# Patient Record
Sex: Female | Born: 1950 | Race: White | Hispanic: No | Marital: Married | Smoking: Never smoker
Health system: Southern US, Community
[De-identification: ages and names within clinical notes are randomized; demographics above are authoritative.]

## PROBLEM LIST (undated history)

## (undated) DIAGNOSIS — H409 Unspecified glaucoma: Secondary | ICD-10-CM

## (undated) DIAGNOSIS — E119 Type 2 diabetes mellitus without complications: Secondary | ICD-10-CM

## (undated) HISTORY — PX: APPENDECTOMY: SHX54

## (undated) HISTORY — PX: WRIST FRACTURE SURGERY: SHX121

## (undated) HISTORY — PX: LIVER SURGERY: SHX698

## (undated) HISTORY — PX: ABDOMINAL HYSTERECTOMY: SHX81

## (undated) HISTORY — PX: CHOLECYSTECTOMY: SHX55

---

## 2017-09-30 ENCOUNTER — Ambulatory Visit
Admission: RE | Admit: 2017-09-30 | Discharge: 2017-09-30 | Disposition: A | Discharge status: Home / Self Care | Payer: Commercial Managed Care - HMO | Source: Ambulatory Visit | Attending: Orthopedic Surgery | Admitting: Orthopedic Surgery

## 2017-09-30 ENCOUNTER — Other Ambulatory Visit: Payer: Self-pay | Admitting: Orthopedic Surgery

## 2017-09-30 DIAGNOSIS — S52532A Colles' fracture of left radius, initial encounter for closed fracture: Secondary | ICD-10-CM | POA: Diagnosis present

## 2017-09-30 DIAGNOSIS — S52572A Other intraarticular fracture of lower end of left radius, initial encounter for closed fracture: Secondary | ICD-10-CM | POA: Insufficient documentation

## 2017-09-30 DIAGNOSIS — X58XXXA Exposure to other specified factors, initial encounter: Secondary | ICD-10-CM | POA: Diagnosis not present

## 2018-01-06 ENCOUNTER — Other Ambulatory Visit: Payer: Self-pay

## 2018-01-06 ENCOUNTER — Ambulatory Visit
Admission: EM | Admit: 2018-01-06 | Discharge: 2018-01-06 | Disposition: A | Discharge status: Home / Self Care | Payer: 59 | Attending: Family Medicine | Admitting: Family Medicine

## 2018-01-06 DIAGNOSIS — R3915 Urgency of urination: Secondary | ICD-10-CM

## 2018-01-06 DIAGNOSIS — R350 Frequency of micturition: Secondary | ICD-10-CM | POA: Diagnosis not present

## 2018-01-06 DIAGNOSIS — R300 Dysuria: Secondary | ICD-10-CM

## 2018-01-06 HISTORY — DX: Type 2 diabetes mellitus without complications: E11.9

## 2018-01-06 HISTORY — DX: Unspecified glaucoma: H40.9

## 2018-01-06 LAB — URINALYSIS, COMPLETE (UACMP) WITH MICROSCOPIC
BILIRUBIN URINE: NEGATIVE
Glucose, UA: NEGATIVE mg/dL
HGB URINE DIPSTICK: NEGATIVE
KETONES UR: NEGATIVE mg/dL
Leukocytes, UA: NEGATIVE
NITRITE: NEGATIVE
PROTEIN: NEGATIVE mg/dL
SPECIFIC GRAVITY, URINE: 1.02 (ref 1.005–1.030)
pH: 5.5 (ref 5.0–8.0)

## 2018-01-06 MED ORDER — NITROFURANTOIN MONOHYD MACRO 100 MG PO CAPS: 100.0000 mg | ORAL_CAPSULE | Freq: Two times a day (BID) | ORAL | 0 refills | Status: AC

## 2018-01-06 NOTE — Discharge Instructions (Addendum)
Take medication as prescribed. Rest. Drink plenty of fluids.  ° °Follow up with your primary care physician this week as needed. Return to Urgent care for new or worsening concerns.  ° °

## 2018-01-06 NOTE — ED Provider Notes (Signed)
[facility] ____________________________________________  Time seen: Approximately 8:56 AM  I have reviewed the triage vital signs and the nursing notes.   HISTORY  Chief Complaint Urinary Frequency  HPI Cathy Fields is a 67 y.o. female past medical history of diabetes presenting for evaluation of urinary complaints.  Reports over the last 1 week she has noticed some intermittent issues and she was concern of urinary tract infections being the culprit.  Patient reports last week she had left-sided back pain, that did resolve after applying heating pad and resting.  Reports no longer having back pain.  Reports she has intermittently been noticing that she is urinating more frequently, getting up more at night and having some urgency.  Reports a few days ago she noticed some odor to her urine, none today.  States she has a history of similar presentation with urinary tract infections.  Denies frequent or recurrent urinary tract infections.  Denies any vaginal discharge, vaginal itching or symptoms similar to previous yeast infections.  Continues to eat and drink well.  States last night she felt warm but denies known fevers.  No antipyretics taken prior to arrival.  Has been drinking a lot of water and did take some over-the-counter Azo a few days ago which did help with symptoms.  Denies other aggravating or alleviating factors.  Denies chest pain, shortness of breath, abdominal pain, dysuria, extremity pain, extremity swelling or rash. Denies recent sickness. Denies recent antibiotic use. Denies renal insufficiency.  Filbert Berthold, MD: PCP   Past Medical History:  Diagnosis Date  . Diabetes (HCC)   . Glaucoma     There are no active problems to display for this patient.   Past Surgical History:  Procedure Laterality Date  . ABDOMINAL HYSTERECTOMY    . APPENDECTOMY    . CHOLECYSTECTOMY    . LIVER SURGERY    . WRIST FRACTURE SURGERY Left      No current  facility-administered medications for this encounter.   Current Outpatient Medications:  .  aspirin 81 MG tablet, Take by mouth., Disp: , Rfl:  .  Cholecalciferol (VITAMIN D) 2000 units CAPS, Take by mouth., Disp: , Rfl:  .  cyclobenzaprine (FLEXERIL) 10 MG tablet, Take by mouth., Disp: , Rfl:  .  dorzolamide-timolol (COSOPT) 22.3-6.8 MG/ML ophthalmic solution, , Disp: , Rfl:  .  glimepiride (AMARYL) 4 MG tablet, glimepiride 4 mg tablet, Disp: , Rfl:  .  ibuprofen (ADVIL,MOTRIN) 800 MG tablet, TK 1 T PO TID, Disp: , Rfl: 0 .  latanoprost (XALATAN) 0.005 % ophthalmic solution, latanoprost 0.005 % eye drops  INSTILL 1 DROP INTO BOTH EYES Q NIGHT, Disp: , Rfl:  .  lisinopril (PRINIVIL,ZESTRIL) 10 MG tablet, lisinopril 10 mg tablet, Disp: , Rfl:  .  metFORMIN (GLUCOPHAGE-XR) 500 MG 24 hr tablet, metformin ER 500 mg tablet,extended release 24 hr, Disp: , Rfl:  .  pioglitazone (ACTOS) 45 MG tablet, pioglitazone 45 mg tablet, Disp: , Rfl:  .  rosuvastatin (CRESTOR) 20 MG tablet, rosuvastatin 20 mg tablet, Disp: , Rfl:  .  timolol (TIMOPTIC-XR) 0.5 % ophthalmic gel-forming, , Disp: , Rfl:  .  nitrofurantoin, macrocrystal-monohydrate, (MACROBID) 100 MG capsule, Take 1 capsule (100 mg total) by mouth 2 (two) times daily., Disp: 10 capsule, Rfl: 0  Allergies Atorvastatin and Codeine  Family History  Problem Relation Age of Onset  . Heart disease Mother   . Dementia Father     Social History Social History   Tobacco Use  . Smoking status:  Never Smoker  . Smokeless tobacco: Never Used  Substance Use Topics  . Alcohol use: Never    Frequency: Never  . Drug use: Never    Review of Systems Constitutional: As above. No measured fever. Cardiovascular: Denies chest pain. Respiratory: Denies shortness of breath. Gastrointestinal: No abdominal pain.  No nausea, no vomiting.  No diarrhea.   Genitourinary: Negative for dysuria. Musculoskeletal: As above. Skin: Negative for  rash.  ____________________________________________   PHYSICAL EXAM:  VITAL SIGNS: ED Triage Vitals  Enc Vitals Group     BP 01/06/18 0830 137/83     Pulse Rate 01/06/18 0830 84     Resp 01/06/18 0830 17     Temp 01/06/18 0830 98 F (36.7 C)     Temp Source 01/06/18 0830 Oral     SpO2 01/06/18 0830 100 %     Weight 01/06/18 0822 190 lb (86.2 kg)     Height 01/06/18 0822  (1.651 m)     Head Circumference --      Peak Flow --      Pain Score 01/06/18 0822 3     Pain Loc --      Pain Edu? --      Excl. in GC? --     Constitutional: Alert and oriented. Well appearing and in no acute distress. Cardiovascular: Normal rate, regular rhythm. Grossly normal heart sounds.  Good peripheral circulation. Respiratory: Normal respiratory effort without tachypnea nor retractions. Breath sounds are clear and equal bilaterally. No wheezes, rales, rhonchi. Gastrointestinal: Soft and nontender. No CVA tenderness. Musculoskeletal:   No midline cervical, thoracic or lumbar tenderness to palpation.Steady gait. Neurologic:  Normal speech and language. Speech is normal. No gait instability.  Skin:  Skin is warm, dry and intact. No rash noted. Psychiatric: Mood and affect are normal. Speech and behavior are normal. Patient exhibits appropriate insight and judgment   ___________________________________________   LABS (all labs ordered are listed, but only abnormal results are displayed)  Labs Reviewed  URINALYSIS, COMPLETE (UACMP) WITH MICROSCOPIC - Abnormal; Notable for the following components:      Result Value   Bacteria, UA RARE (*)    All other components within normal limits  URINE CULTURE   ____________________________________________  RADIOLOGY  No results found. ____________________________________________   PROCEDURES Procedures   INITIAL IMPRESSION / ASSESSMENT AND PLAN / ED COURSE  Pertinent labs & imaging results that were available during my care of the patient  were reviewed by me and considered in my medical decision making (see chart for details).  Well appearing patient.  Dysuria symptoms x1 week.  Denies any vaginal complaints.  No current back pain.  Urinalysis reviewed, discussed in detail with patient not clear UTI.  Will empirically start on oral Macrobid as complaints are atypical for patient and await urine culture.  Discussed if culture negative, will stop antibiotics at that time, patient agrees with this plan.Discussed indication, risks and benefits of medications with patient.  Discussed follow up with Primary care physician this week. Discussed follow up and return parameters including no resolution or any worsening concerns. Patient verbalized understanding and agreed to plan.   ____________________________________________   FINAL CLINICAL IMPRESSION(S) / ED DIAGNOSES  Final diagnoses:  Dysuria     ED Discharge Orders        Ordered    nitrofurantoin, macrocrystal-monohydrate, (MACROBID) 100 MG capsule  2 times daily     01/06/18 0846       Note: This dictation was prepared with  Dragon dictation along with smaller Lobbyist. Any transcriptional errors that result from this process are unintentional.         Renford Dills, NP 01/06/18 0930

## 2018-01-06 NOTE — ED Triage Notes (Signed)
Patient reports that symptoms started one week ago. Patient states that it originally started with low back pain, foul-smelling odor, urinary pressure, frequency with decreased output. Patient states that yesterday she was running a low grade fever. Patient reports that she is a diabetic and wanted to rule out a UTI.

## 2018-01-07 ENCOUNTER — Telehealth: Payer: Self-pay | Admitting: Emergency Medicine

## 2018-01-07 LAB — URINE CULTURE: CULTURE: NO GROWTH

## 2018-01-07 NOTE — Telephone Encounter (Signed)
Patient called regarding culture results.  Negative urine culture results communicated, patient verbalized understanding. nmw

## 2018-01-07 NOTE — Progress Notes (Signed)
Urine culture is negative, attempted to reach patient. No answer at this time. Voicemail left.

## 2019-02-05 IMAGING — CT CT WRIST*L* W/O CM
3 series · 9 of 34 positions shown, 10 images · non-contrast
Comparison: None.

CLINICAL DATA: Fall this morning, wrist fracture.

EXAM:
CT OF THE LEFT WRIST WITHOUT CONTRAST
TECHNIQUE: Multidetector CT imaging was performed according to the standard
protocol. Multiplanar CT image reconstructions were also generated.

[Series 5: axial st · axial · 0.18mm/px · z∈[-109,-109]mm · 1 of 131 slices shown, 2 images]
[im 71/131  soft-tissue]
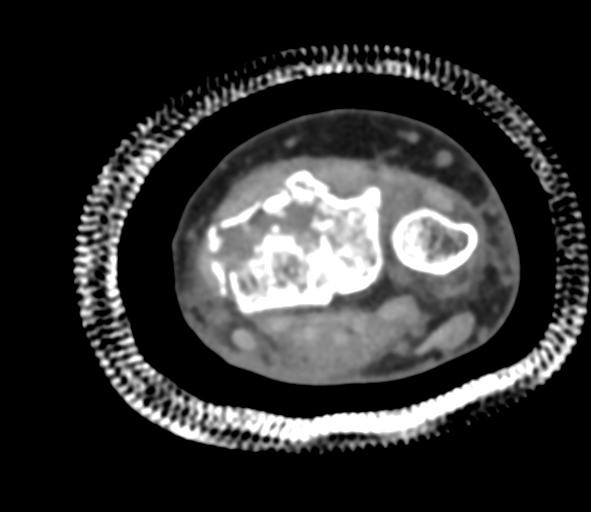
[im 71/131  bone]
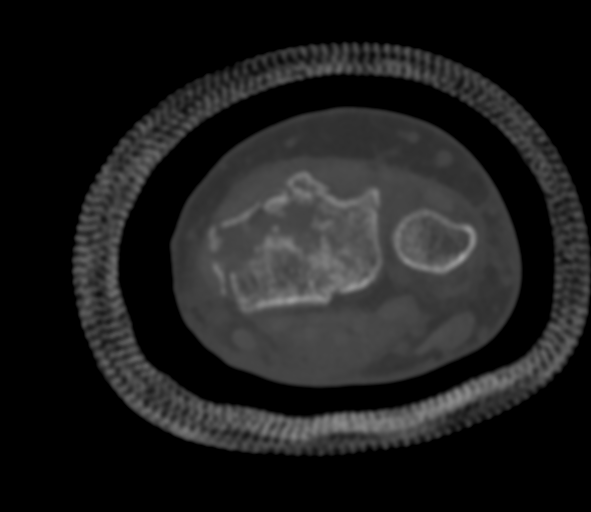

[Series 7: cor st · coronal · 0.17mm/px · 3 of 71 slices shown]
[im 15/71  bone]
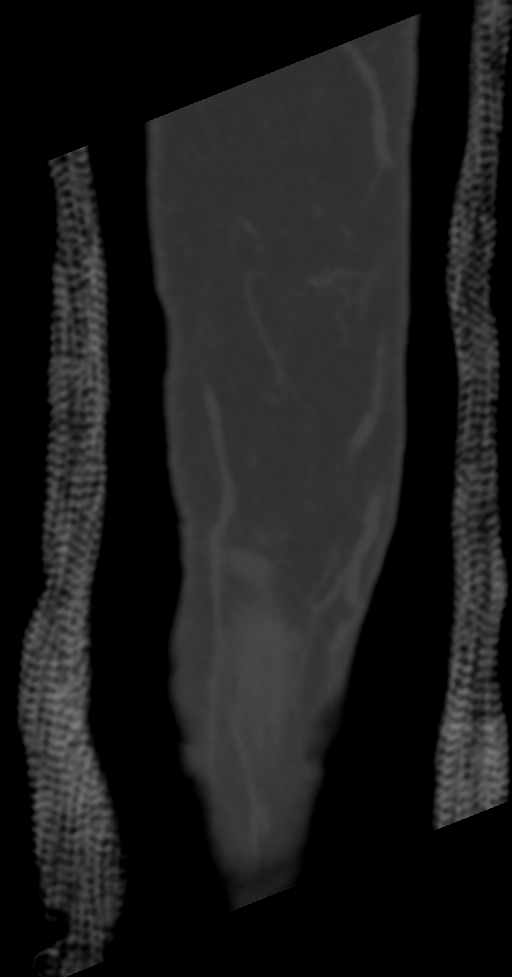
[im 29/71  bone]
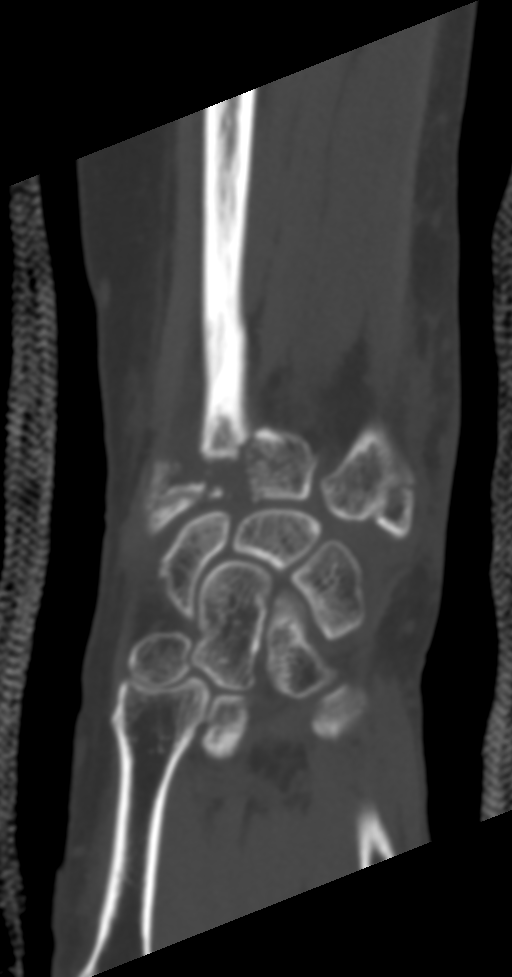
[im 43/71  bone]
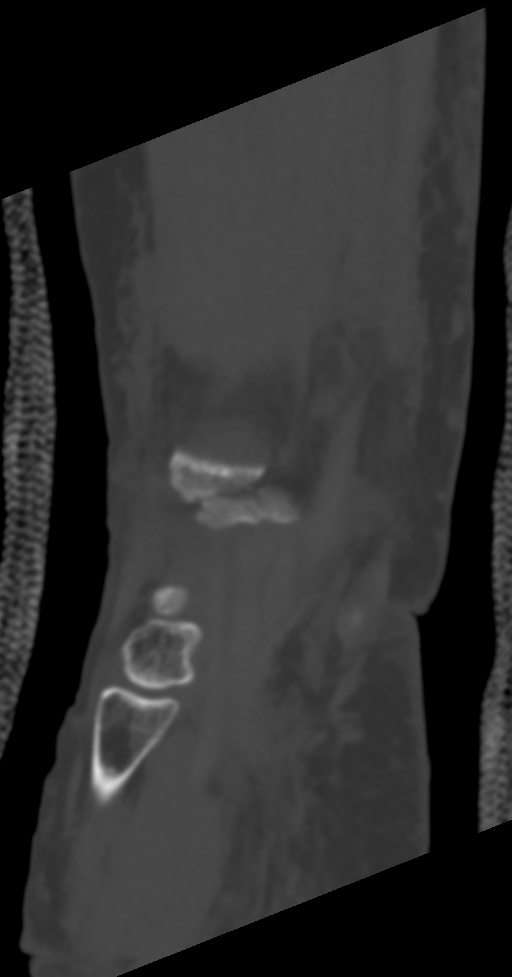

[Series 9: sag st · sagittal · 0.14mm/px · 5 of 81 slices shown]
[im 27/81  bone]
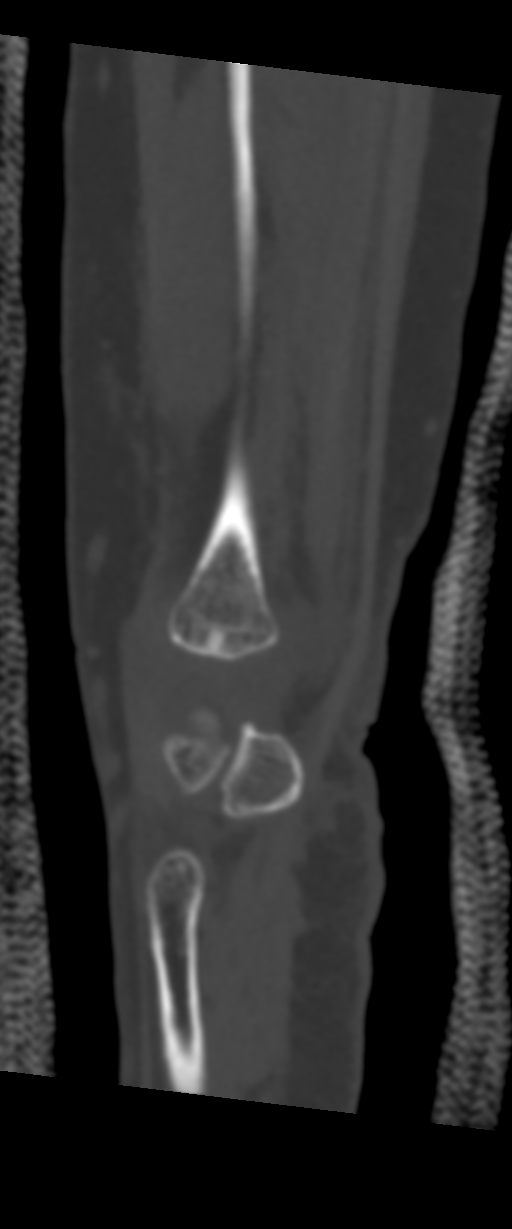
[im 34/81  bone]
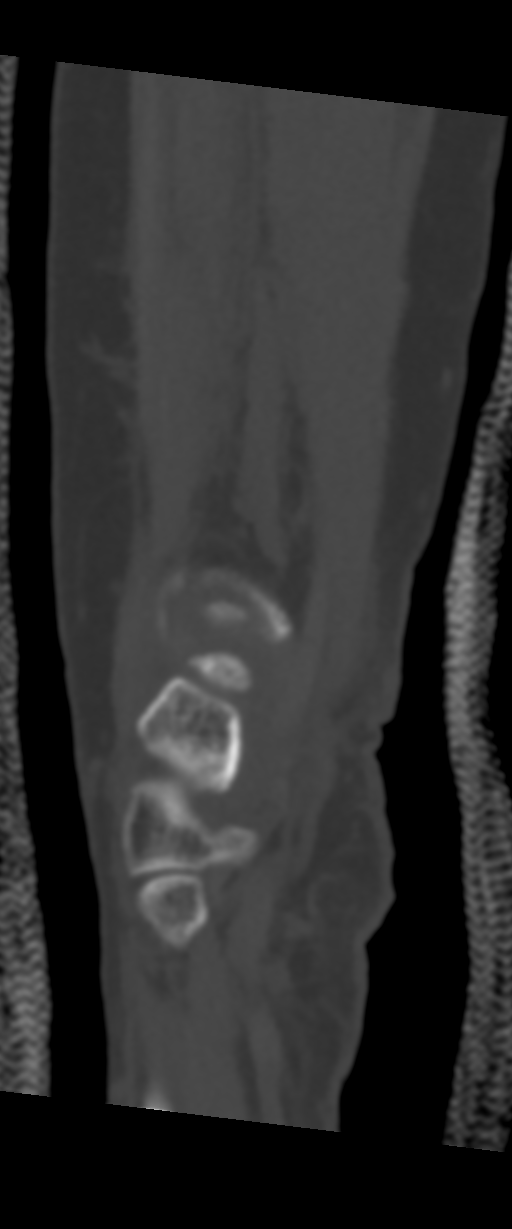
[im 41/81  bone]
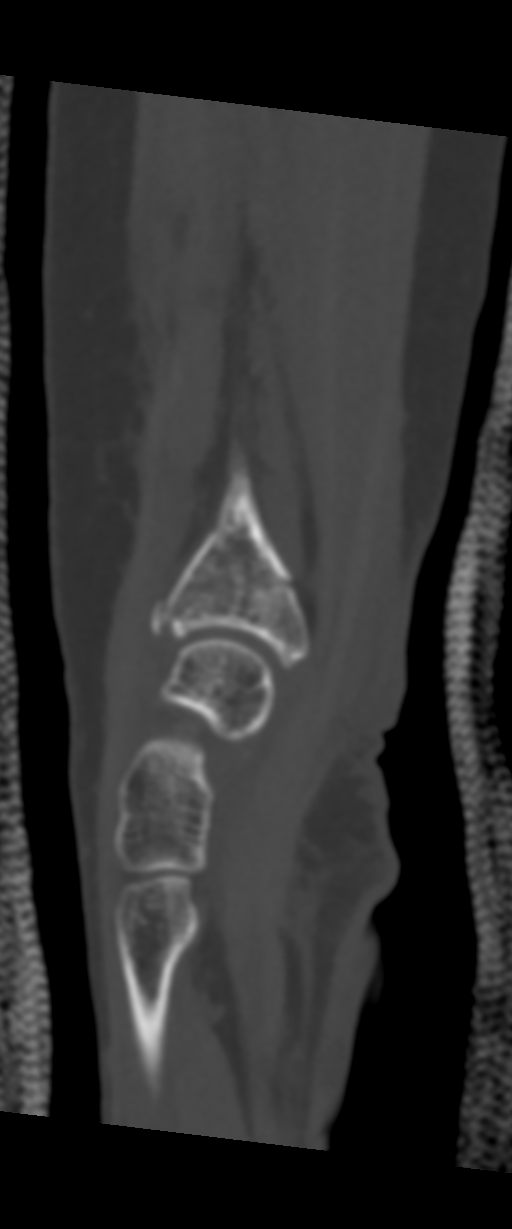
[im 47/81  bone]
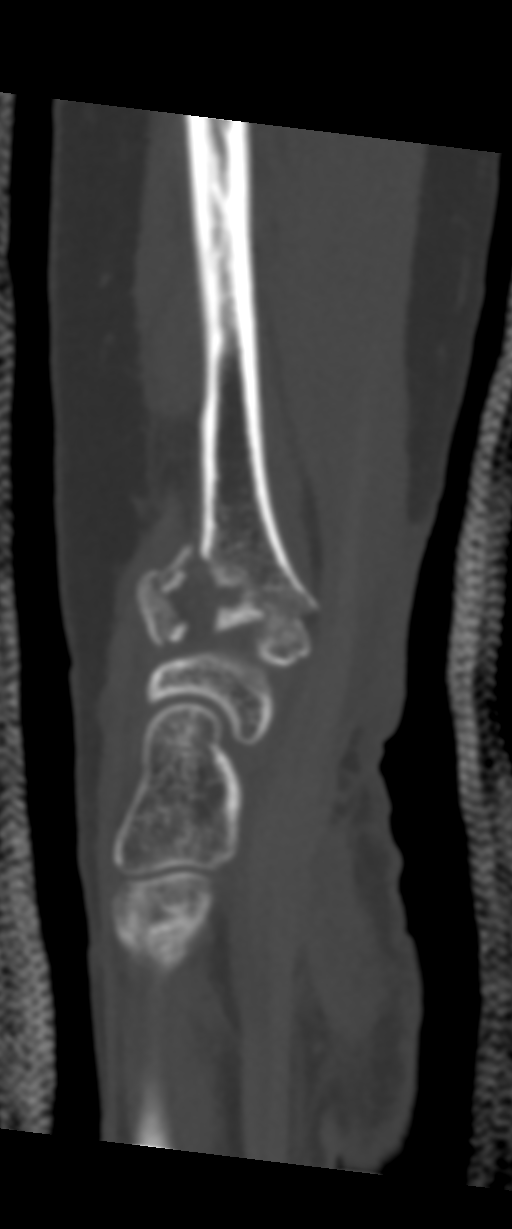
[im 54/81  bone]
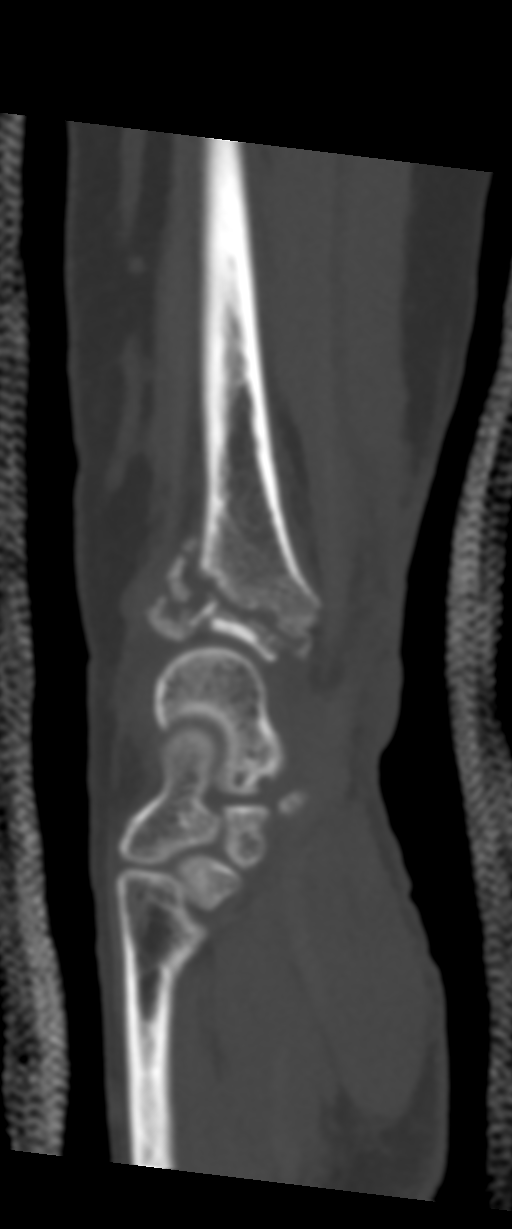

[9 of 34 positions shown; findings below may reference images not displayed]

FINDINGS: Bones/Joint/Cartilage

Multifragmentary (AO/OTA 23-C3) distal radial fracture with multiple
articular surface fragments, up to about 4 mm of radial shortening,
and with both dorsal and volar obliquely extending fracture planes
involving both the articular surface and the metaphysis. Radial
styloid fracture intersects the comminuted fractures along the
articular surface. There is up to about 3.5 mm of step-off along the
fragments along with some impaction of the central fragments. In
general there is more dorsal than volar impaction resulting in about
8 degrees of apex volar angulation between the shaft and the main
distal fragments.

Fracture through the base of the ulnar styloid. No appreciable
fracture the carpal bones.

Ligaments

Suboptimally assessed by CT. No widening of the scapholunate or
lunatotriquetral intervals.

Muscles and Tendons

No appreciable tendon entrapment within the fracture planes.

Soft tissues

As expected there is subcutaneous edema along the wrist primarily
volar but also radial and ulnar.
IMPRESSION: 1. Multifragmentary (AO/OTA 23-C3) distal radial fracture with
multiple fracture planes intersecting along the articular surface
and extending out to the metaphyses, as well as in intersecting
radial styloid fracture. A central cortical fragment is mildly
embedded down into the fracture planes for example on image 35/8.
Other features of the fracture are as described above.
2. Fracture through the base of the ulnar styloid.

These results will be called to the ordering clinician or
representative by the Radiologist Assistant, and communication
documented in the PACS or zVision Dashboard.
